# Patient Record
Sex: Female | Born: 1995 | Race: Asian | Hispanic: No | Marital: Single | Smoking: Never smoker
Health system: Southern US, Community
[De-identification: ages and names within clinical notes are randomized; demographics above are authoritative.]

## PROBLEM LIST (undated history)

## (undated) DIAGNOSIS — I471 Supraventricular tachycardia, unspecified: Secondary | ICD-10-CM

---

## 2020-10-05 ENCOUNTER — Observation Stay (HOSPITAL_COMMUNITY)
Admission: EM | Admit: 2020-10-05 | Discharge: 2020-10-07 | Disposition: A | Discharge status: Home / Self Care | Payer: Self-pay | Attending: Internal Medicine | Admitting: Internal Medicine

## 2020-10-05 DIAGNOSIS — R7989 Other specified abnormal findings of blood chemistry: Secondary | ICD-10-CM | POA: Diagnosis present

## 2020-10-05 DIAGNOSIS — R748 Abnormal levels of other serum enzymes: Secondary | ICD-10-CM | POA: Insufficient documentation

## 2020-10-05 DIAGNOSIS — I471 Supraventricular tachycardia, unspecified: Secondary | ICD-10-CM

## 2020-10-05 DIAGNOSIS — R778 Other specified abnormalities of plasma proteins: Secondary | ICD-10-CM | POA: Diagnosis present

## 2020-10-05 DIAGNOSIS — I214 Non-ST elevation (NSTEMI) myocardial infarction: Secondary | ICD-10-CM

## 2020-10-05 DIAGNOSIS — Z20822 Contact with and (suspected) exposure to covid-19: Secondary | ICD-10-CM | POA: Insufficient documentation

## 2020-10-05 HISTORY — DX: Supraventricular tachycardia, unspecified: I47.10

## 2020-10-05 HISTORY — DX: Supraventricular tachycardia: I47.1

## 2020-10-06 ENCOUNTER — Encounter (HOSPITAL_COMMUNITY): Payer: Self-pay

## 2020-10-06 ENCOUNTER — Observation Stay (HOSPITAL_BASED_OUTPATIENT_CLINIC_OR_DEPARTMENT_OTHER): Payer: Self-pay

## 2020-10-06 ENCOUNTER — Other Ambulatory Visit: Payer: Self-pay

## 2020-10-06 ENCOUNTER — Emergency Department (HOSPITAL_COMMUNITY): Payer: Self-pay

## 2020-10-06 DIAGNOSIS — R7989 Other specified abnormal findings of blood chemistry: Secondary | ICD-10-CM

## 2020-10-06 DIAGNOSIS — I471 Supraventricular tachycardia, unspecified: Secondary | ICD-10-CM

## 2020-10-06 DIAGNOSIS — I472 Ventricular tachycardia: Secondary | ICD-10-CM

## 2020-10-06 DIAGNOSIS — R778 Other specified abnormalities of plasma proteins: Secondary | ICD-10-CM

## 2020-10-06 LAB — I-STAT CHEM 8, ED
BUN: 21 mg/dL — ABNORMAL HIGH (ref 6–20)
Calcium, Ion: 1.08 mmol/L — ABNORMAL LOW (ref 1.15–1.40)
Chloride: 106 mmol/L (ref 98–111)
Creatinine, Ser: 0.6 mg/dL (ref 0.44–1.00)
Glucose, Bld: 149 mg/dL — ABNORMAL HIGH (ref 70–99)
HCT: 40 % (ref 36.0–46.0)
Hemoglobin: 13.6 g/dL (ref 12.0–15.0)
Potassium: 5.5 mmol/L — ABNORMAL HIGH (ref 3.5–5.1)
Sodium: 139 mmol/L (ref 135–145)
TCO2: 25 mmol/L (ref 22–32)

## 2020-10-06 LAB — I-STAT BETA HCG BLOOD, ED (MC, WL, AP ONLY): I-stat hCG, quantitative: <5 m[IU]/mL (ref <5)

## 2020-10-06 LAB — LIPID PANEL
Cholesterol: 130 mg/dL (ref 0–200)
HDL: 57 mg/dL (ref >40)
LDL Cholesterol: 67 mg/dL (ref 0–99)
Total CHOL/HDL Ratio: 2.3 RATIO
Triglycerides: 28 mg/dL (ref <150)
VLDL: 6 mg/dL (ref 0–40)

## 2020-10-06 LAB — CBC WITH DIFFERENTIAL/PLATELET
Abs Immature Granulocytes: 0.04 10*3/uL (ref 0.00–0.07)
Basophils Absolute: 0 10*3/uL (ref 0.0–0.1)
Basophils Relative: 0 %
Eosinophils Absolute: 0 10*3/uL (ref 0.0–0.5)
Eosinophils Relative: 0 %
HCT: 42.5 % (ref 36.0–46.0)
Hemoglobin: 13.8 g/dL (ref 12.0–15.0)
Immature Granulocytes: 0 %
Lymphocytes Relative: 7 %
Lymphs Abs: 0.9 10*3/uL (ref 0.7–4.0)
MCH: 28.4 pg (ref 26.0–34.0)
MCHC: 32.5 g/dL (ref 30.0–36.0)
MCV: 87.4 fL (ref 80.0–100.0)
Monocytes Absolute: 0.4 10*3/uL (ref 0.1–1.0)
Monocytes Relative: 3 %
Neutro Abs: 11.9 10*3/uL — ABNORMAL HIGH (ref 1.7–7.7)
Neutrophils Relative %: 90 %
Platelets: 254 10*3/uL (ref 150–400)
RBC: 4.86 MIL/uL (ref 3.87–5.11)
RDW: 13.6 % (ref 11.5–15.5)
WBC: 13.3 10*3/uL — ABNORMAL HIGH (ref 4.0–10.5)
nRBC: 0 % (ref 0.0–0.2)

## 2020-10-06 LAB — COMPREHENSIVE METABOLIC PANEL
ALT: 39 U/L (ref 0–44)
AST: 64 U/L — ABNORMAL HIGH (ref 15–41)
Albumin: 4.7 g/dL (ref 3.5–5.0)
Alkaline Phosphatase: 52 U/L (ref 38–126)
Anion gap: 12 (ref 5–15)
BUN: 16 mg/dL (ref 6–20)
CO2: 21 mmol/L — ABNORMAL LOW (ref 22–32)
Calcium: 9.4 mg/dL (ref 8.9–10.3)
Chloride: 106 mmol/L (ref 98–111)
Creatinine, Ser: 0.81 mg/dL (ref 0.44–1.00)
GFR, Estimated: >60 mL/min (ref >60)
Glucose, Bld: 149 mg/dL — ABNORMAL HIGH (ref 70–99)
Potassium: 4.7 mmol/L (ref 3.5–5.1)
Sodium: 139 mmol/L (ref 135–145)
Total Bilirubin: 1.1 mg/dL (ref 0.3–1.2)
Total Protein: 7.4 g/dL (ref 6.5–8.1)

## 2020-10-06 LAB — RAPID URINE DRUG SCREEN, HOSP PERFORMED
Amphetamines: NOT DETECTED
Barbiturates: NOT DETECTED
Benzodiazepines: NOT DETECTED
Cocaine: NOT DETECTED
Opiates: NOT DETECTED
Tetrahydrocannabinol: NOT DETECTED

## 2020-10-06 LAB — BASIC METABOLIC PANEL
Anion gap: 5 (ref 5–15)
BUN: 12 mg/dL (ref 6–20)
CO2: 21 mmol/L — ABNORMAL LOW (ref 22–32)
Calcium: 7.2 mg/dL — ABNORMAL LOW (ref 8.9–10.3)
Chloride: 112 mmol/L — ABNORMAL HIGH (ref 98–111)
Creatinine, Ser: 0.46 mg/dL (ref 0.44–1.00)
GFR, Estimated: >60 mL/min (ref >60)
Glucose, Bld: 103 mg/dL — ABNORMAL HIGH (ref 70–99)
Potassium: 3.5 mmol/L (ref 3.5–5.1)
Sodium: 138 mmol/L (ref 135–145)

## 2020-10-06 LAB — HEMOGLOBIN A1C
Hgb A1c MFr Bld: 5.4 % (ref 4.8–5.6)
Mean Plasma Glucose: 108.28 mg/dL

## 2020-10-06 LAB — TSH: TSH: 0.622 u[IU]/mL (ref 0.350–4.500)

## 2020-10-06 LAB — TROPONIN I (HIGH SENSITIVITY)
Troponin I (High Sensitivity): 2386 ng/L (ref <18)
Troponin I (High Sensitivity): 4525 ng/L (ref <18)
Troponin I (High Sensitivity): 6125 ng/L (ref <18)
Troponin I (High Sensitivity): 6446 ng/L (ref <18)

## 2020-10-06 LAB — ECHOCARDIOGRAM COMPLETE
Area-P 1/2: 3.91 cm2
Height: 65 in
S' Lateral: 3 cm
Weight: 1504 oz

## 2020-10-06 LAB — RESP PANEL BY RT-PCR (FLU A&B, COVID) ARPGX2
Influenza A by PCR: NEGATIVE
Influenza B by PCR: NEGATIVE
SARS Coronavirus 2 by RT PCR: NEGATIVE

## 2020-10-06 LAB — BRAIN NATRIURETIC PEPTIDE: B Natriuretic Peptide: 286.6 pg/mL — ABNORMAL HIGH (ref 0.0–100.0)

## 2020-10-06 LAB — HIV ANTIBODY (ROUTINE TESTING W REFLEX): HIV Screen 4th Generation wRfx: NONREACTIVE

## 2020-10-06 MED ORDER — ACETAMINOPHEN 325 MG PO TABS: 650.0000 mg | ORAL_TABLET | Freq: Four times a day (QID) | ORAL | Status: DC | PRN

## 2020-10-06 MED ORDER — ENOXAPARIN SODIUM 30 MG/0.3ML ~~LOC~~ SOLN: 30.0000 mg | SUBCUTANEOUS | Status: DC

## 2020-10-06 MED ORDER — ASPIRIN 81 MG PO CHEW
CHEWABLE_TABLET | ORAL | Status: AC
  Filled 2020-10-06: qty 3

## 2020-10-06 MED ORDER — SODIUM CHLORIDE 0.9 % IV BOLUS
2000.0000 mL | Freq: Once | INTRAVENOUS | Status: AC
  Administered 2020-10-06: 2000 mL via INTRAVENOUS

## 2020-10-06 MED ORDER — ONDANSETRON HCL 4 MG/2ML IJ SOLN: 4.0000 mg | Freq: Four times a day (QID) | INTRAMUSCULAR | Status: DC | PRN

## 2020-10-06 MED ORDER — HEPARIN (PORCINE) 25000 UT/250ML-% IV SOLN
500.0000 [IU]/h | INTRAVENOUS | Status: DC
  Administered 2020-10-06: 500 [IU]/h via INTRAVENOUS
  Filled 2020-10-06: qty 250

## 2020-10-06 MED ORDER — ACETAMINOPHEN 650 MG RE SUPP: 650.0000 mg | Freq: Four times a day (QID) | RECTAL | Status: DC | PRN

## 2020-10-06 MED ORDER — HEPARIN BOLUS VIA INFUSION
2000.0000 [IU] | Freq: Once | INTRAVENOUS | Status: AC
  Administered 2020-10-06: 2000 [IU] via INTRAVENOUS
  Filled 2020-10-06: qty 2000

## 2020-10-06 MED ORDER — ASPIRIN 81 MG PO CHEW
324.0000 mg | CHEWABLE_TABLET | Freq: Once | ORAL | Status: AC
  Administered 2020-10-06: 324 mg via ORAL
  Filled 2020-10-06: qty 4

## 2020-10-06 MED ORDER — ADENOSINE 6 MG/2ML IV SOLN
6.0000 mg | Freq: Once | INTRAVENOUS | Status: AC
  Administered 2020-10-06: 6 mg via INTRAVENOUS
  Filled 2020-10-06: qty 2

## 2020-10-06 MED ORDER — ONDANSETRON HCL 4 MG/2ML IJ SOLN: 4.0000 mg | Freq: Once | INTRAMUSCULAR | Status: AC

## 2020-10-06 MED ORDER — POLYETHYLENE GLYCOL 3350 17 G PO PACK: 17.0000 g | PACK | Freq: Every day | ORAL | Status: DC | PRN

## 2020-10-06 MED ORDER — ONDANSETRON HCL 4 MG PO TABS: 4.0000 mg | ORAL_TABLET | Freq: Four times a day (QID) | ORAL | Status: DC | PRN

## 2020-10-06 MED ORDER — SODIUM CHLORIDE 0.9% FLUSH
3.0000 mL | Freq: Two times a day (BID) | INTRAVENOUS | Status: DC
  Administered 2020-10-06: 3 mL via INTRAVENOUS

## 2020-10-06 MED ORDER — IOHEXOL 350 MG/ML SOLN
100.0000 mL | Freq: Once | INTRAVENOUS | Status: AC | PRN
  Administered 2020-10-06: 85 mL via INTRAVENOUS

## 2020-10-06 MED ORDER — METOPROLOL SUCCINATE ER 25 MG PO TB24
12.5000 mg | ORAL_TABLET | Freq: Every day | ORAL | Status: DC
  Administered 2020-10-07: 12.5 mg via ORAL
  Filled 2020-10-06: qty 1

## 2020-10-06 MED ORDER — SODIUM CHLORIDE 0.9 % IV SOLN: INTRAVENOUS | Status: DC

## 2020-10-06 MED ORDER — ONDANSETRON HCL 4 MG/2ML IJ SOLN
INTRAMUSCULAR | Status: AC
  Administered 2020-10-06: 4 mg via INTRAVENOUS
  Filled 2020-10-06: qty 2

## 2020-10-06 MED ORDER — ENOXAPARIN SODIUM 40 MG/0.4ML ~~LOC~~ SOLN
40.0000 mg | SUBCUTANEOUS | Status: DC
  Administered 2020-10-06: 40 mg via SUBCUTANEOUS
  Filled 2020-10-06: qty 0.4

## 2020-10-06 NOTE — ED Notes (Signed)
Back from CT

## 2020-10-06 NOTE — ED Provider Notes (Signed)
632 am case d/w Cardiology via phone.  EDP expressed concerns about this increasing troponin despite optimal management.  Patient will be seen first this am.     Nicanor Alcon, Willetta York, MD 10/06/20 [phone]

## 2020-10-06 NOTE — Progress Notes (Signed)
Asked pt again about the IV fluids per MD order, but patient still refused.

## 2020-10-06 NOTE — Progress Notes (Signed)
Pt refused IVF to run at 66ml/hr per MD order due to her being up independently around the room and to the bathroom.

## 2020-10-06 NOTE — Plan of Care (Signed)
Patient presented with palpitations with associated nausea and dizziness.  She was found to have an SVT and was hypotensive. HR was in the 170s. She received adenosine x 1 and converted to NSR.  CTA chest was also negative for PE.  Troponin checked during SVT was markedly elevated (2386) and has since continued to rise [phone] She was started on a heparin drip during initial troponin elevation.  Case was discussed with cardiology.  Formal eval to follow. Initial EKG was noted to have subtle ST depression in inferior leads that resolved on repeat EKG.  BNP mildly elevated 286.  Patient has no known prior history of cardiac disease or heart failure. An echo has also been ordered. After adenosine, she has had no further symptoms and has remained comfortable.  Full H&P to follow.  Continue heparin drip and trending troponin.  Follow up echo.  Repeat EKG if any recurrent CP or palpitations.   Lewie Chamber, MD [facility] 10/06/2020, 6:38 AM

## 2020-10-06 NOTE — ED Notes (Signed)
Marla Roe (friend) can be reached at [phone]

## 2020-10-06 NOTE — ED Notes (Signed)
[facility] overnight paged: 3rd troponin 6125.

## 2020-10-06 NOTE — Plan of Care (Signed)
  Problem: Clinical Measurements: Goal: Will remain free from infection Outcome: Progressing Goal: Diagnostic test results will improve Outcome: Progressing Goal: Cardiovascular complication will be avoided Outcome: Progressing   Problem: Activity: Goal: Risk for activity intolerance will decrease Outcome: Progressing   Problem: Pain Managment: Goal: General experience of comfort will improve Outcome: Progressing   Problem: Safety: Goal: Ability to remain free from injury will improve Outcome: Progressing

## 2020-10-06 NOTE — Progress Notes (Signed)
This RN used ipad interpreter, Bonita Quin [phone] to complete admission assessment upon arrival to the floor.

## 2020-10-06 NOTE — H&P (Signed)
History and Physical  [name] TOI:[id number] DOB: 10-Oct-1995 DOA: 10/05/2020  PCP: Pcp, No Patient coming from: Home    Chief Complaint: Palpitations  HPI: Casey Romero is a 25 y.o. female Mandarin speaking patient with no significant past medical history comes into the hospital for palpitations that started the day prior to admission.  She relates this is happened one before it was a short episode where she was short of breath with palpitations that resolved within 10 minutes.  This episode started the day prior to admission and has been is constant she relates palpitation with shortness of breath nothing makes it better or worse she does relate some lightheadedness no loss of consciousness no sweating.  She did had some nausea that happened once but has now resolved. She denies any chest pressure, PND  In the ED: Twelve-lead EKG was done that showed normal axis SVT no appreciated ST segment depression.   Review of Systems: All systems reviewed and apart from history of presenting illness, are negative.  History reviewed. No pertinent past medical history. History reviewed. No pertinent surgical history. Social History:  has no history on file for tobacco use, alcohol use, and drug use.   No Known Allergies  Family History  Problem Relation Age of Onset  . Hypertension Father     Prior to Admission medications   Not on File   Physical Exam: Vitals:   10/06/20 0430 10/06/20 0500 10/06/20 0530 10/06/20 0600  BP: 97/67 103/69 101/67 103/63  Pulse: 68 69 61 70  Resp: 15 19 18 17   Temp:      TempSrc:      SpO2: 96% 100% 99% 97%  Weight:      Height:         General exam: Moderately built and nourished patient, lying comfortably supine on the gurney in no obvious distress.  Head, eyes and ENT: Nontraumatic and normocephalic. Pupils equally reacting to light and accommodation. Oral mucosa moist.  Neck: Supple. No JVD, carotid bruit or thyromegaly.  Lymphatics:  No lymphadenopathy.  Respiratory system: Clear to auscultation. No increased work of breathing.  Cardiovascular system: S1 and S2 heard, RRR. No JVD, murmurs, gallops, clicks or pedal edema.  Gastrointestinal system: Abdomen is nondistended, soft and nontender. Normal bowel sounds heard. No organomegaly or masses appreciated.  Central nervous system: Alert and oriented. No focal neurological deficits.  Extremities: Symmetric 5 x 5 power. Peripheral pulses symmetrically felt.   Skin: No rashes or acute findings.  Musculoskeletal system: Negative exam.  Psychiatry: Pleasant and cooperative.   Labs on Admission:  Basic Metabolic Panel: Recent Labs  Lab 10/06/20 0011 10/06/20 0016  NA 139 139  K 4.7 5.5*  CL 106 106  CO2 21*  --   GLUCOSE 149* 149*  BUN 16 21*  CREATININE 0.81 0.60  CALCIUM 9.4  --    Liver Function Tests: Recent Labs  Lab 10/06/20 0011  AST 64*  ALT 39  ALKPHOS 52  BILITOT 1.1  PROT 7.4  ALBUMIN 4.7   No results for input(s): LIPASE, AMYLASE in the last 168 hours. No results for input(s): AMMONIA in the last 168 hours. CBC: Recent Labs  Lab 10/06/20 0011 10/06/20 0016  WBC 13.3*  --   NEUTROABS 11.9*  --   HGB 13.8 13.6  HCT 42.5 40.0  MCV 87.4  --   PLT 254  --    Cardiac Enzymes: No results for input(s): CKTOTAL, CKMB, CKMBINDEX, TROPONINI in the  last 168 hours.  BNP (last 3 results) No results for input(s): PROBNP in the last 8760 hours. CBG: No results for input(s): GLUCAP in the last 168 hours.  Radiological Exams on Admission: CT Angio Chest PE W and/or Wo Contrast  Result Date: 10/06/2020 CLINICAL DATA:  Chest pain and shortness of breath starting earlier today. EXAM: CT ANGIOGRAPHY CHEST WITH CONTRAST TECHNIQUE: Multidetector CT imaging of the chest was performed using the standard protocol during bolus administration of intravenous contrast. Multiplanar CT image reconstructions and MIPs were obtained to evaluate the vascular  anatomy. CONTRAST:  81mL OMNIPAQUE IOHEXOL 350 MG/ML SOLN COMPARISON:  Chest radiograph 10/06/2020 FINDINGS: Cardiovascular: Good opacification of the central and segmental pulmonary arteries. No focal filling defects. No evidence of significant pulmonary embolus. Normal heart size. No pericardial effusions. Normal caliber thoracic aorta. Mediastinum/Nodes: Esophagus is decompressed. No significant lymphadenopathy in the chest. Increased density in the anterior mediastinum likely representing residual thymic tissue. Lungs/Pleura: Lungs are clear. No pleural effusions. No pneumothorax. Upper Abdomen: No acute abnormalities demonstrated in the visualized upper abdomen. Musculoskeletal: No chest wall abnormality. No acute or significant osseous findings. Review of the MIP images confirms the above findings. IMPRESSION: 1. No evidence of significant pulmonary embolus. 2. No evidence of active pulmonary disease. Electronically Signed   By: Burman Nieves M.D.   On: 10/06/2020 01:01   DG Chest Portable 1 View  Result Date: 10/06/2020 CLINICAL DATA:  Dyspnea, hypotension EXAM: PORTABLE CHEST 1 VIEW COMPARISON:  None. FINDINGS: The heart size and mediastinal contours are within normal limits. Both lungs are clear. The visualized skeletal structures are unremarkable. IMPRESSION: No active disease. Electronically Signed   By: Helyn Numbers MD   On: 10/06/2020 00:32    EKG: Independently reviewed.  Normal axis appears to be SVT no significant ST segment depression.  Has a wandering base. Lead EKG after adenosine was given shows sinus rhythm normal axis normal interval no ST segment abnormalities she is symptom-free.  Assessment/Plan Supraventricular tachycardia: Twelve-lead EKG show SVT she was given a dose of adenosine and she has converted back to sinus rhythm she relates her symptoms has resolved. Admit under observation to a telemetry unit. Check a 2D echo, I will place an order of IV diltiazem push as  needed. Cardiology has been consulted awaiting further recommendations.  Will probably need further evaluation as an outpatient. Angio the chest was negative for PE or cardiopulmonary disease.  Elevated troponin Likely demand ischemia and a heart rate of 1 70-200.  She denies any chest pain after she was given adenosine and her heart rate improved she relates her shortness of breath and palpitations resolved. There is another Theodis Aguas ordered it will probably be elevated has been less than 4 hours since her symptoms were corrected.   DVT Prophylaxis: lovenox Code Status: full  Family Communication: none  Disposition Plan: inpatient      It is my clinical opinion that admission to observation is reasonable and necessary in this 25 y.o. female Who presented with symptoms of palpitation and shortness of breath was found to be in SVT resolved with adenosine.   Marinda Elk MD [facility]   10/06/2020, 7:29 AM

## 2020-10-06 NOTE — Plan of Care (Signed)
  Problem: Activity: Goal: Risk for activity intolerance will decrease Outcome: Progressing   Problem: Pain Managment: Goal: General experience of comfort will improve Outcome: Progressing   Problem: Safety: Goal: Ability to remain free from injury will improve Outcome: Progressing   Problem: Coping: Goal: Level of anxiety will decrease Outcome: Progressing   Problem: Skin Integrity: Goal: Risk for impaired skin integrity will decrease Outcome: Progressing

## 2020-10-06 NOTE — ED Triage Notes (Signed)
Pt presents from home, c/o palpitations starting around 11am. Also c/o nausea and dizziness. Pt is from [place] and states something like this has happened once before

## 2020-10-06 NOTE — Progress Notes (Signed)
  Echocardiogram 2D Echocardiogram has been performed.  Augustine Radar 10/06/2020, 8:45 AM

## 2020-10-06 NOTE — ED Notes (Signed)
Patients friend has left.

## 2020-10-06 NOTE — ED Provider Notes (Addendum)
[facility]-EMERGENCY DEPT Provider Note   CSN: [id number] Arrival date & time: 10/05/20  2340     History Chief Complaint  Patient presents with  . Palpitations    Casey Romero is a 25 y.o. female.  The history is provided by the patient. The history is limited by a language barrier.  Palpitations Palpitations quality:  Fast Onset quality:  Sudden Duration:  13 hours (started at 11 am while cleaning) Timing:  Constant Progression:  Unchanged Chronicity:  New Context: not anxiety, not caffeine, not illicit drugs, not nicotine and not stimulant use   Relieved by:  Nothing Worsened by:  Nothing Ineffective treatments:  None tried Associated symptoms: chest pressure, nausea, PND, shortness of breath and vomiting   Risk factors: no hx of PE and no hyperthyroidism   Patient with no PMH nor home medications presents with 12 hours of palpitation and SOB, n/v and some pressure that started while she was cleaning.       History reviewed. No pertinent past medical history.  There are no problems to display for this patient.   History reviewed. No pertinent surgical history.   OB History   No obstetric history on file.     History reviewed. No pertinent family history.     Home Medications Prior to Admission medications   Not on File    Allergies    Patient has no known allergies.  Review of Systems   Review of Systems  Unable to perform ROS: Acuity of condition  Respiratory: Positive for shortness of breath.   Cardiovascular: Positive for palpitations and PND.  Gastrointestinal: Positive for nausea and vomiting.  Neurological: Negative for facial asymmetry.  Psychiatric/Behavioral: Negative for agitation.    Physical Exam Updated Vital Signs BP 106/73   Pulse 73   Temp 98 F (36.7 C) (Oral)   Resp (!) 22   Ht 5\' 5"  (1.651 m)   Wt 42.6 kg   LMP 10/01/2020   SpO2 100%   BMI 15.64 kg/m   Physical Exam Vitals and nursing note  reviewed.  Constitutional:      Appearance: She is diaphoretic.  HENT:     Head: Normocephalic and atraumatic.     Nose: Nose normal.  Eyes:     Conjunctiva/sclera: Conjunctivae normal.     Pupils: Pupils are equal, round, and reactive to light.  Cardiovascular:     Rate and Rhythm: Normal rate and regular rhythm.     Pulses: Normal pulses.     Heart sounds: Normal heart sounds.  Pulmonary:     Effort: Pulmonary effort is normal.     Breath sounds: Normal breath sounds.  Abdominal:     General: Abdomen is flat. Bowel sounds are normal.     Palpations: Abdomen is soft.     Tenderness: There is no abdominal tenderness. There is no guarding.  Musculoskeletal:        General: Normal range of motion.     Cervical back: Normal range of motion and neck supple.  Skin:    General: Skin is warm.     Capillary Refill: Capillary refill takes less than 2 seconds.     Comments: Mottling of the upper chest   Neurological:     General: No focal deficit present.     Mental Status: She is alert and oriented to person, place, and time.     Deep Tendon Reflexes: Reflexes normal.  Psychiatric:        Mood and  Affect: Mood normal.        Behavior: Behavior normal.     ED Results / Procedures / Treatments   Labs (all labs ordered are listed, but only abnormal results are displayed) Results for orders placed or performed during the hospital encounter of 10/05/20  CBC with Differential/Platelet  Result Value Ref Range   WBC 13.3 (H) 4.0 - 10.5 K/uL   RBC 4.86 3.87 - 5.11 MIL/uL   Hemoglobin 13.8 12.0 - 15.0 g/dL   HCT 78.242.5 95.636.0 - 21.346.0 %   MCV 87.4 80.0 - 100.0 fL   MCH 28.4 26.0 - 34.0 pg   MCHC 32.5 30.0 - 36.0 g/dL   RDW 08.613.6 57.811.5 - 46.915.5 %   Platelets 254 150 - 400 K/uL   nRBC 0.0 0.0 - 0.2 %   Neutrophils Relative % 90 %   Neutro Abs 11.9 (H) 1.7 - 7.7 K/uL   Lymphocytes Relative 7 %   Lymphs Abs 0.9 0.7 - 4.0 K/uL   Monocytes Relative 3 %   Monocytes Absolute 0.4 0.1 - 1.0 K/uL    Eosinophils Relative 0 %   Eosinophils Absolute 0.0 0.0 - 0.5 K/uL   Basophils Relative 0 %   Basophils Absolute 0.0 0.0 - 0.1 K/uL   Immature Granulocytes 0 %   Abs Immature Granulocytes 0.04 0.00 - 0.07 K/uL  Comprehensive metabolic panel  Result Value Ref Range   Sodium 139 135 - 145 mmol/L   Potassium 4.7 3.5 - 5.1 mmol/L   Chloride 106 98 - 111 mmol/L   CO2 21 (L) 22 - 32 mmol/L   Glucose, Bld 149 (H) 70 - 99 mg/dL   BUN 16 6 - 20 mg/dL   Creatinine, Ser 6.290.81 0.44 - 1.00 mg/dL   Calcium 9.4 8.9 - 52.810.3 mg/dL   Total Protein 7.4 6.5 - 8.1 g/dL   Albumin 4.7 3.5 - 5.0 g/dL   AST 64 (H) 15 - 41 U/L   ALT 39 0 - 44 U/L   Alkaline Phosphatase 52 38 - 126 U/L   Total Bilirubin 1.1 0.3 - 1.2 mg/dL   GFR, Estimated >41>60 >32>60 mL/min   Anion gap 12 5 - 15  I-stat chem 8, ED ([facility])  Result Value Ref Range   Sodium 139 135 - 145 mmol/L   Potassium 5.5 (H) 3.5 - 5.1 mmol/L   Chloride 106 98 - 111 mmol/L   BUN 21 (H) 6 - 20 mg/dL   Creatinine, Ser 4.400.60 0.44 - 1.00 mg/dL   Glucose, Bld 102149 (H) 70 - 99 mg/dL   Calcium, Ion 7.251.08 (L) 1.15 - 1.40 mmol/L   TCO2 25 22 - 32 mmol/L   Hemoglobin 13.6 12.0 - 15.0 g/dL   HCT 36.640.0 44.036.0 - 34.746.0 %  I-Stat Beta hCG blood, ED ([facility], [facility], [facility] only)  Result Value Ref Range   I-stat hCG, quantitative <5.0 <5 mIU/mL   Comment 3          Troponin I (High Sensitivity)  Result Value Ref Range   Troponin I (High Sensitivity) 2,386 (HH) <18 ng/L   CT Angio Chest PE W and/or Wo Contrast  Result Date: 10/06/2020 CLINICAL DATA:  Chest pain and shortness of breath starting earlier today. EXAM: CT ANGIOGRAPHY CHEST WITH CONTRAST TECHNIQUE: Multidetector CT imaging of the chest was performed using the standard protocol during bolus administration of intravenous contrast. Multiplanar CT image reconstructions and MIPs were obtained to evaluate the vascular anatomy. CONTRAST:  3mL OMNIPAQUE IOHEXOL 350 MG/ML SOLN COMPARISON:  Chest radiograph 10/06/2020  FINDINGS: Cardiovascular: Good opacification of the central and segmental pulmonary arteries. No focal filling defects. No evidence of significant pulmonary embolus. Normal heart size. No pericardial effusions. Normal caliber thoracic aorta. Mediastinum/Nodes: Esophagus is decompressed. No significant lymphadenopathy in the chest. Increased density in the anterior mediastinum likely representing residual thymic tissue. Lungs/Pleura: Lungs are clear. No pleural effusions. No pneumothorax. Upper Abdomen: No acute abnormalities demonstrated in the visualized upper abdomen. Musculoskeletal: No chest wall abnormality. No acute or significant osseous findings. Review of the MIP images confirms the above findings. IMPRESSION: 1. No evidence of significant pulmonary embolus. 2. No evidence of active pulmonary disease. Electronically Signed   By: Burman Nieves M.D.   On: 10/06/2020 01:01   DG Chest Portable 1 View  Result Date: 10/06/2020 CLINICAL DATA:  Dyspnea, hypotension EXAM: PORTABLE CHEST 1 VIEW COMPARISON:  None. FINDINGS: The heart size and mediastinal contours are within normal limits. Both lungs are clear. The visualized skeletal structures are unremarkable. IMPRESSION: No active disease. Electronically Signed   By: Helyn Numbers MD   On: 10/06/2020 00:32    EKG  EKG Interpretation  Date/Time:  Tuesday Woodward Klem 26 2022 00:22:13 EDT Ventricular Rate:  53 PR Interval:  132 QRS Duration: 124 QT Interval:  407 QTC Calculation: 383 R Axis:   85 Text Interpretation: Sinus rhythm IVCD, consider atypical RBBB Confirmed by Nicanor Alcon, Elric Tirado ([id number]) on 10/06/2020 1:49:41 AM       Radiology CT Angio Chest PE W and/or Wo Contrast  Result Date: 10/06/2020 CLINICAL DATA:  Chest pain and shortness of breath starting earlier today. EXAM: CT ANGIOGRAPHY CHEST WITH CONTRAST TECHNIQUE: Multidetector CT imaging of the chest was performed using the standard protocol during bolus administration of intravenous  contrast. Multiplanar CT image reconstructions and MIPs were obtained to evaluate the vascular anatomy. CONTRAST:  65mL OMNIPAQUE IOHEXOL 350 MG/ML SOLN COMPARISON:  Chest radiograph 10/06/2020 FINDINGS: Cardiovascular: Good opacification of the central and segmental pulmonary arteries. No focal filling defects. No evidence of significant pulmonary embolus. Normal heart size. No pericardial effusions. Normal caliber thoracic aorta. Mediastinum/Nodes: Esophagus is decompressed. No significant lymphadenopathy in the chest. Increased density in the anterior mediastinum likely representing residual thymic tissue. Lungs/Pleura: Lungs are clear. No pleural effusions. No pneumothorax. Upper Abdomen: No acute abnormalities demonstrated in the visualized upper abdomen. Musculoskeletal: No chest wall abnormality. No acute or significant osseous findings. Review of the MIP images confirms the above findings. IMPRESSION: 1. No evidence of significant pulmonary embolus. 2. No evidence of active pulmonary disease. Electronically Signed   By: Burman Nieves M.D.   On: 10/06/2020 01:01   DG Chest Portable 1 View  Result Date: 10/06/2020 CLINICAL DATA:  Dyspnea, hypotension EXAM: PORTABLE CHEST 1 VIEW COMPARISON:  None. FINDINGS: The heart size and mediastinal contours are within normal limits. Both lungs are clear. The visualized skeletal structures are unremarkable. IMPRESSION: No active disease. Electronically Signed   By: Helyn Numbers MD   On: 10/06/2020 00:32    Procedures Adenosine Cardioversion: Patient with HR in the 170s, symptomatic x 13 hours.  Placed on zoll anterior posterior pads, recording function on.   6 mg of adenosine given with bolus of IVF with immediate conversion to sinus bradycardia.    Medications Ordered in ED Medications  0.9 %  sodium chloride infusion (has no administration in time range)  heparin ADULT infusion 100 units/mL (25000 units/265mL) (has no administration in time range)   heparin  bolus via infusion 2,000 Units (has no administration in time range)  adenosine (ADENOCARD) 6 MG/2ML injection 6 mg (6 mg Intravenous Given 10/06/20 0019)  ondansetron (ZOFRAN) injection 4 mg (4 mg Intravenous Given 10/06/20 0012)  sodium chloride 0.9 % bolus 2,000 mL (0 mLs Intravenous Stopped 10/06/20 0124)  iohexol (OMNIPAQUE) 350 MG/ML injection 100 mL (85 mLs Intravenous Contrast Given 10/06/20 0039)  aspirin chewable tablet 324 mg ( Oral Not Given 10/06/20 0132)    ED Course  I have reviewed the triage vital signs and the nursing notes.  Pertinent labs & imaging results that were available during my care of the patient were reviewed by me and considered in my medical decision making (see chart for details).   136 Case d/w Dr. Rosita Fire of cardiology who states patient can be admitted to medicine at [facility]. EDP asked again if patient should be admitted to cardiology given EKG changes and elevation of troponin in a healthy female. Informed patient can be admitted to medicine at [facility] and to cycle cardiac enzymes.  No heparin at this time  EDP made a decision based on degree of concern to start heparin.    Given marked elevation of second troponin EDP consulted cardiology again.  No call back.  I will continue to cycle cardiac enzymes and to hydrate the patient.    MDM Reviewed: nursing note and vitals Interpretation: ECG, x-ray and labs (NACPD by me on cxr, positive troponin normal electrolytes ) Total time providing critical care: 75-105 minutes (heparin drip ). This excludes time spent performing separately reportable procedures and services. Consults: cardiology and admitting MD (case d/w Dr. Rosita Fire of cardiology, please admit to medicine and obtain echo in am )  CRITICAL CARE Performed by: Katha Kuehne K Siris Hoos-Rasch Total critical care time: 75 minutes Critical care time was exclusive of separately billable procedures and treating other patients. Critical care was necessary to treat or prevent  imminent or life-threatening deterioration. Critical care was time spent personally by me on the following activities: development of treatment plan with patient and/or surrogate as well as nursing, discussions with consultants, evaluation of patient's response to treatment, examination of patient, obtaining history from patient or surrogate, ordering and performing treatments and interventions, ordering and review of laboratory studies, ordering and review of radiographic studies, pulse oximetry and re-evaluation of patient's condition.  Final Clinical Impression(s) / ED Diagnoses Final diagnoses:  SVT (supraventricular tachycardia) (HCC)   Admit to medicine        Cambrie Sonnenfeld, MD 10/06/20 [id number]

## 2020-10-06 NOTE — Progress Notes (Signed)
ANTICOAGULATION CONSULT NOTE - Initial Consult  Pharmacy Consult for Heparin Indication: chest pain/ACS  No Known Allergies  Patient Measurements: Height: 5\' 5"  (165.1 cm) Weight: 42.6 kg (94 lb) IBW/kg (Calculated) : 57 Heparin Dosing Weight: actual body weight  Vital Signs: Temp: 98 F (36.7 C) (04/25 2349) Temp Source: Oral (04/25 2349) BP: 108/69 (04/26 0200) Pulse Rate: 73 (04/26 0200)  Labs: Recent Labs    10/06/20 0011 10/06/20 0016  HGB 13.8 13.6  HCT 42.5 40.0  PLT 254  --   CREATININE 0.81 0.60  TROPONINIHS 2,386*  --     Estimated Creatinine Clearance: 72.3 mL/min (by C-G formula based on SCr of 0.6 mg/dL).   Medical History: History reviewed. No pertinent past medical history.  Medications:  No medications PTA reported  Assessment:  25 yr female presents with palpitations, chest pressure, nausea, SOB and vomiting  No significant PMH  Pharmacy asked to dose IV heparin  Goal of Therapy:  Heparin level 0.3-0.7 units/ml Monitor platelets by anticoagulation protocol: Yes   Plan:   Heparin 2000 units IV bolus x 1 followed by heparin gtt @ 500 units/hr  Check heparin level 6 hr after heparin gtt started  Follow daily heparin level and CBC  Monitor for signs/symptoms of bleeding  22, PharmD 10/06/2020,2:09 AM

## 2020-10-06 NOTE — ED Notes (Signed)
Patient is resting in room. Complains of no chest pain or shortness of breath. Patient was given ice chips.

## 2020-10-06 NOTE — ED Notes (Signed)
Trop Ferne Reus , RN aware

## 2020-10-06 NOTE — Consult Note (Addendum)
Cardiology Consultation:   Patient ID: Casey Romero MRN: [id number]; DOB: 04-03-1996  Admit date: 10/05/2020 Date of Consult: 10/06/2020  PCP:  [facility]  Cardiologist:  Casey Nose, MD new  Patient Profile:   Casey Romero is a 25 y.o. female with a hx of palpitations who is being seen today for the evaluation of SVT and elevated troponin at the request of Dr. Robb Romero.  History of Present Illness:   Casey Romero has no prior cardiac history. She is visiting from [place]. She presented to [facility] with palpitations starting at 11AM found to be in SVT with heart rate 173. This was accompanied by chest pressure and shortness of breath. She was given adenosine 6 mg and converted to sinus rhythm HR 53. HS troponin 2386 --> 4525 --> 6125 --> 6446. EDP consulted cardiology who recommended against heparin gtt. EDP started heparin drip due to concern with rising troponin.   TSH 0.622 WBC 13.3 AST 64 K 5.5 BNP 286  She states she has had palpitations once before associated with SOB and lasted about 10 min before spontaneously resolving. This occurred several years ago (8-10 years ago) when she was in high school. She reports resolution of her chest discomfort and feels much better at this time. Symptoms started at approximately 11AM yesterday - it is possible she was in SVT for over 12 hrs prior to conversion with adenosine. During that time, she does report chest tightness, SOB, and seeing spots with mild dizziness. She did not pass out.  She does state that her mother has had episodes very similar to this and is now on medication.   She does not smoke or drink. She has lived in [place] for 4-5 years. Her aunt and her aunt's family live in [place]. She is currently not working and not in school. She is only visiting [place] and is not sure when she will return to [place]. I will make her follow up with [place] in case she is still in [place].     Past Medical History:  Diagnosis Date  . PSVT  (paroxysmal supraventricular tachycardia) (HCC)     History reviewed. No pertinent surgical history.   Home Medications:  Prior to Admission medications   Not on File    Inpatient Medications: Scheduled Meds: . enoxaparin (LOVENOX) injection  40 mg Subcutaneous Q24H   Continuous Infusions: . sodium chloride     PRN Meds:   Allergies:   No Known Allergies  Social History:   Social History   Socioeconomic History  . Marital status: Single    Spouse name: Not on file  . Number of children: Not on file  . Years of education: Not on file  . Highest education level: Not on file  Occupational History  . Not on file  Tobacco Use  . Smoking status: Never Smoker  . Smokeless tobacco: Never Used  Substance and Sexual Activity  . Alcohol use: Not Currently  . Drug use: Not on file  . Sexual activity: Not on file  Other Topics Concern  . Not on file  Social History Narrative  . Not on file   Social Determinants of Health   Financial Resource Strain: Not on file  Food Insecurity: Not on file  Transportation Needs: Not on file  Physical Activity: Not on file  Stress: Not on file  Social Connections: Not on file  Intimate Partner Violence: Not on file    Family History:  Family History  Problem Relation Age of Onset  . Hypertension Father   . Supraventricular tachycardia Mother      ROS:  Please see the history of present illness.   All other ROS reviewed and negative.     Physical Exam/Data:   Vitals:   10/06/20 0430 10/06/20 0500 10/06/20 0530 10/06/20 0600  BP: 97/67 103/69 101/67 103/63  Pulse: 68 69 61 70  Resp: 15 19 18 17   Temp:      TempSrc:      SpO2: 96% 100% 99% 97%  Weight:      Height:        Intake/Output Summary (Last 24 hours) at 10/06/2020 0956 Last data filed at 10/06/2020 0124 Gross per 24 hour  Intake 2000 ml  Output --  Net 2000 ml   Last 3 Weights 10/05/2020  Weight (lbs) 94 lb  Weight (kg) 42.638 kg     Body mass index  is 15.64 kg/m.  General:  Well nourished, well developed, in no acute distress HEENT: normal Lymph: no adenopathy Neck: no JVD Endocrine:  No thryomegaly Vascular: No carotid bruits; FA pulses 2+ bilaterally without bruits  Cardiac:  normal S1, S2; RRR; no murmur  Lungs:  clear to auscultation bilaterally, no wheezing, rhonchi or rales  Abd: soft, nontender, no hepatomegaly  Ext: no edema Musculoskeletal:  No deformities, BUE and BLE strength normal and equal Skin: warm and dry  Neuro:  CNs 2-12 intact, no focal abnormalities noted Psych:  Normal affect   EKG:  The EKG was personally reviewed and demonstrates:  Sinus rhythm HR 78 Telemetry:  Telemetry was personally reviewed and demonstrates:  SVT with rates in the 170s, adenosine administration resulted in PVCs and conversion to sinus rhythm, now in sinus in the 70s with PVCs  Relevant CV Studies:  Echo pending  Laboratory Data:  High Sensitivity Troponin:   Recent Labs  Lab 10/06/20 0011 10/06/20 0212 10/06/20 0518 10/06/20 0730  TROPONINIHS 2,386* 4,525* 6,125* 6,446*     Chemistry Recent Labs  Lab 10/06/20 0011 10/06/20 0016 10/06/20 0730  NA 139 139 138  K 4.7 5.5* 3.5  CL 106 106 112*  CO2 21*  --  21*  GLUCOSE 149* 149* 103*  BUN 16 21* 12  CREATININE 0.81 0.60 0.46  CALCIUM 9.4  --  7.2*  GFRNONAA >60  --  >60  ANIONGAP 12  --  5    Recent Labs  Lab 10/06/20 0011  PROT 7.4  ALBUMIN 4.7  AST 64*  ALT 39  ALKPHOS 52  BILITOT 1.1   Hematology Recent Labs  Lab 10/06/20 0011 10/06/20 0016  WBC 13.3*  --   RBC 4.86  --   HGB 13.8 13.6  HCT 42.5 40.0  MCV 87.4  --   MCH 28.4  --   MCHC 32.5  --   RDW 13.6  --   PLT 254  --    BNP Recent Labs  Lab 10/06/20 0505  BNP 286.6*    DDimer No results for input(s): DDIMER in the last 168 hours.   Radiology/Studies:  CT Angio Chest PE W and/or Wo Contrast  Result Date: 10/06/2020 CLINICAL DATA:  Chest pain and shortness of breath  starting earlier today. EXAM: CT ANGIOGRAPHY CHEST WITH CONTRAST TECHNIQUE: Multidetector CT imaging of the chest was performed using the standard protocol during bolus administration of intravenous contrast. Multiplanar CT image reconstructions and MIPs were obtained to evaluate the vascular anatomy. CONTRAST:  57mL OMNIPAQUE  IOHEXOL 350 MG/ML SOLN COMPARISON:  Chest radiograph 10/06/2020 FINDINGS: Cardiovascular: Good opacification of the central and segmental pulmonary arteries. No focal filling defects. No evidence of significant pulmonary embolus. Normal heart size. No pericardial effusions. Normal caliber thoracic aorta. Mediastinum/Nodes: Esophagus is decompressed. No significant lymphadenopathy in the chest. Increased density in the anterior mediastinum likely representing residual thymic tissue. Lungs/Pleura: Lungs are clear. No pleural effusions. No pneumothorax. Upper Abdomen: No acute abnormalities demonstrated in the visualized upper abdomen. Musculoskeletal: No chest wall abnormality. No acute or significant osseous findings. Review of the MIP images confirms the above findings. IMPRESSION: 1. No evidence of significant pulmonary embolus. 2. No evidence of active pulmonary disease. Electronically Signed   By: Burman Nieves M.D.   On: 10/06/2020 01:01   DG Chest Portable 1 View  Result Date: 10/06/2020 CLINICAL DATA:  Dyspnea, hypotension EXAM: PORTABLE CHEST 1 VIEW COMPARISON:  None. FINDINGS: The heart size and mediastinal contours are within normal limits. Both lungs are clear. The visualized skeletal structures are unremarkable. IMPRESSION: No active disease. Electronically Signed   By: Helyn Numbers MD   On: 10/06/2020 00:32     Assessment and Plan:   Paroxysmal SVT - HR in the 170s on arrival with 12-leak concerning for SVT - she was given adenosine x 1 dose with conversion to sinus rhythm - symptoms resolved, no recurrence of palpitations, SOB, or chest pressure - TSH WNL -  echo pending - if echo normal, consider discharge home - may consider verapamil vs cardizem at discharge   Elevated troponin - hs troponin 2386 --> 4525 --> 6125 --> 6446 - CTA negative for PE and does not mention aortic atherosclerosis or coronary calcification - EDP started heparin gtt overnight - CE appear to be leveling off - do not suspect ACS, likely due to greater than 12 hrs of SVT - she is much more confortable - will defer decisions regarding heparin to attending   I discussed with her two vagal maneuvers she may try if this occurs again. We also discussed ablation if this persisted.   I will make her a follow up appt with [place] in the event that she is still in [place].    Risk Assessment/Risk Scores:      For questions or updates, please contact [facility] Please consult [email or web address] for contact info under    Signed, Marcelino Duster, PA  10/06/2020 9:56 AM

## 2020-10-06 NOTE — ED Notes (Signed)
Patient is attempting to void using the bedpan.

## 2020-10-07 LAB — CBC
HCT: 32.1 % — ABNORMAL LOW (ref 36.0–46.0)
Hemoglobin: 10.4 g/dL — ABNORMAL LOW (ref 12.0–15.0)
MCH: 28.5 pg (ref 26.0–34.0)
MCHC: 32.4 g/dL (ref 30.0–36.0)
MCV: 87.9 fL (ref 80.0–100.0)
Platelets: 183 10*3/uL (ref 150–400)
RBC: 3.65 MIL/uL — ABNORMAL LOW (ref 3.87–5.11)
RDW: 14 % (ref 11.5–15.5)
WBC: 5.6 10*3/uL (ref 4.0–10.5)
nRBC: 0 % (ref 0.0–0.2)

## 2020-10-07 LAB — BASIC METABOLIC PANEL
Anion gap: 4 — ABNORMAL LOW (ref 5–15)
BUN: 11 mg/dL (ref 6–20)
CO2: 25 mmol/L (ref 22–32)
Calcium: 8.2 mg/dL — ABNORMAL LOW (ref 8.9–10.3)
Chloride: 111 mmol/L (ref 98–111)
Creatinine, Ser: 0.46 mg/dL (ref 0.44–1.00)
GFR, Estimated: >60 mL/min (ref >60)
Glucose, Bld: 97 mg/dL (ref 70–99)
Potassium: 3.6 mmol/L (ref 3.5–5.1)
Sodium: 140 mmol/L (ref 135–145)

## 2020-10-07 MED ORDER — METOPROLOL SUCCINATE ER 25 MG PO TB24: 12.5000 mg | ORAL_TABLET | Freq: Every day | ORAL | 3 refills | Status: AC

## 2020-10-07 NOTE — Progress Notes (Signed)
RN reviewed discharge paperwork with pt via Ipad interpreter Bonita Quin [phone]. All questions addressed. Educated on new script for metoprolol. RN administered metoprolol per MD order at this time and informed pt not to take again until tomorrow. Pt verbalized understanding. IVs and tele monitor will be removed once her ride arrives. Pt is calling her friend to come pick her up.

## 2020-10-07 NOTE — Discharge Summary (Signed)
Physician Discharge Summary  Meghin Thivierge VOJ:[id number] DOB: 1996-01-08 DOA: 10/05/2020  PCP: Pcp, No  Admit date: 10/05/2020 Discharge date: 10/07/2020  Admitted From: Home Disposition:  Home  Recommendations for Outpatient Follow-up:  1. Follow up with Cardiology in 1-2 weeks 2. Please obtain BMP/CBC in one week  Home Health:No Equipment/Devices:None  Discharge Condition:Stable CODE STATUS:Full Diet recommendation: Heart Healthy  Brief/Interim Summary: 25 y.o. female Mandarin speaking patient with no significant past medical history comes into the hospital for palpitations that started the day prior to admission.  She relates this is happened one before it was a short episode where she was short of breath with palpitations that resolved within 10 minutes.  This episode started the day prior to admission and has been is constant she relates palpitation with shortness of breath nothing makes it better or worse she does relate some lightheadedness no loss of consciousness no sweating.  Discharge Diagnoses:  Active Problems:   Elevated troponin   SVT (supraventricular tachycardia) (HCC)  Spontaneous supraventricular tachycardia: Twelve-lead EKG on admission showed SVT she was given adenosine and she was converted she was started on oral metoprolol, cardiology was consulted recommended a 2D echo that showed no abnormalities. CT angio of the chest was negative for PE or cardiopulmonary disease. Cardiology recommended to follow-up with them as an outpatient for an ablation.  Elevated troponin: Slight likely demand ischemia in the setting of a heart rate of 1 70-200.  She remained chest pain-free throughout this episode.  Discharge Instructions  Discharge Instructions    Diet - low sodium heart healthy   Complete by: As directed    Increase activity slowly   Complete by: As directed      Allergies as of 10/07/2020   No Known Allergies     Medication List    TAKE these  medications   metoprolol succinate 25 MG 24 hr tablet Commonly known as: TOPROL-XL Take 0.5 tablets (12.5 mg total) by mouth daily.       No Known Allergies  Consultations:  Cardiology   Procedures/Studies: CT Angio Chest PE W and/or Wo Contrast  Result Date: 10/06/2020 CLINICAL DATA:  Chest pain and shortness of breath starting earlier today. EXAM: CT ANGIOGRAPHY CHEST WITH CONTRAST TECHNIQUE: Multidetector CT imaging of the chest was performed using the standard protocol during bolus administration of intravenous contrast. Multiplanar CT image reconstructions and MIPs were obtained to evaluate the vascular anatomy. CONTRAST:  80mL OMNIPAQUE IOHEXOL 350 MG/ML SOLN COMPARISON:  Chest radiograph 10/06/2020 FINDINGS: Cardiovascular: Good opacification of the central and segmental pulmonary arteries. No focal filling defects. No evidence of significant pulmonary embolus. Normal heart size. No pericardial effusions. Normal caliber thoracic aorta. Mediastinum/Nodes: Esophagus is decompressed. No significant lymphadenopathy in the chest. Increased density in the anterior mediastinum likely representing residual thymic tissue. Lungs/Pleura: Lungs are clear. No pleural effusions. No pneumothorax. Upper Abdomen: No acute abnormalities demonstrated in the visualized upper abdomen. Musculoskeletal: No chest wall abnormality. No acute or significant osseous findings. Review of the MIP images confirms the above findings. IMPRESSION: 1. No evidence of significant pulmonary embolus. 2. No evidence of active pulmonary disease. Electronically Signed   By: Burman Nieves M.D.   On: 10/06/2020 01:01   DG Chest Portable 1 View  Result Date: 10/06/2020 CLINICAL DATA:  Dyspnea, hypotension EXAM: PORTABLE CHEST 1 VIEW COMPARISON:  None. FINDINGS: The heart size and mediastinal contours are within normal limits. Both lungs are clear. The visualized skeletal structures are unremarkable. IMPRESSION: No active disease.  Electronically Signed   By: Helyn NumbersAshesh  Parikh MD   On: 10/06/2020 00:32   ECHOCARDIOGRAM COMPLETE  Result Date: 10/06/2020    ECHOCARDIOGRAM REPORT   [facility] Date of Exam: 10/06/2020 Medical Rec #:  [id number]    Height:       65.0 in Accession #:    [id number]   Weight:       94.0 lb Date of Birth:  22-Aug-1995    BSA:          1.435 m Patient Age:    25 years     BP:           103/63 mmHg Patient Gender: F            HR:           64 bpm. Exam Location:  Inpatient Procedure: 2D Echo, Cardiac Doppler and Color Doppler Indications:    Elevated Troponin  History:        Patient has no prior history of Echocardiogram examinations.  Sonographer:    Eulah PontSarah Pirrotta RDCS Referring Phys: [phone] DAVID GIRGUIS IMPRESSIONS  1. Left ventricular ejection fraction, by estimation, is 60 to 65%. The left ventricle has normal function. The left ventricle has no regional wall motion abnormalities. Left ventricular diastolic parameters were normal.  2. Right ventricular systolic function is normal. The right ventricular size is normal.  3. The mitral valve is normal in structure. Trivial mitral valve regurgitation. No evidence of mitral stenosis.  4. The aortic valve is normal in structure. Aortic valve regurgitation is not visualized. No aortic stenosis is present.  5. The inferior vena cava is dilated in size with <50% respiratory variability, suggesting right atrial pressure of 15 mmHg. FINDINGS  Left Ventricle: Left ventricular ejection fraction, by estimation, is 60 to 65%. The left ventricle has normal function. The left ventricle has no regional wall motion abnormalities. The left ventricular internal cavity size was normal in size. There is  no left ventricular hypertrophy. Left ventricular diastolic parameters were normal. Normal left ventricular filling pressure. Right Ventricle: The right ventricular size is normal. No increase in right ventricular wall thickness. Right ventricular systolic function is  normal. Left Atrium: Left atrial size was normal in size. Right Atrium: Right atrial size was normal in size. Pericardium: There is no evidence of pericardial effusion. Mitral Valve: The mitral valve is normal in structure. Trivial mitral valve regurgitation. No evidence of mitral valve stenosis. Tricuspid Valve: The tricuspid valve is normal in structure. Tricuspid valve regurgitation is not demonstrated. No evidence of tricuspid stenosis. Aortic Valve: The aortic valve is normal in structure. Aortic valve regurgitation is not visualized. No aortic stenosis is present. Pulmonic Valve: The pulmonic valve was normal in structure. Pulmonic valve regurgitation is not visualized. No evidence of pulmonic stenosis. Aorta: The aortic root is normal in size and structure. Venous: The inferior vena cava is dilated in size with less than 50% respiratory variability, suggesting right atrial pressure of 15 mmHg. IAS/Shunts: No atrial level shunt detected by color flow Doppler.  LEFT VENTRICLE PLAX 2D LVIDd:         4.30 cm  Diastology LVIDs:         3.00 cm  LV e' medial:    12.60 cm/s LV PW:         0.80 cm  LV E/e' medial:  8.3 LV IVS:        0.50 cm  LV e' lateral:   14.30  cm/s LVOT diam:     1.80 cm  LV E/e' lateral: 7.3 LV SV:         45 LV SV Index:   32 LVOT Area:     2.54 cm  RIGHT VENTRICLE RV S prime:     12.50 cm/s TAPSE (M-mode): 2.3 cm LEFT ATRIUM             Index       RIGHT ATRIUM          Index LA diam:        2.80 cm 1.95 cm/m  RA Area:     7.07 cm LA Vol (A2C):   21.3 ml 14.84 ml/m RA Volume:   10.40 ml 7.25 ml/m LA Vol (A4C):   24.9 ml 17.35 ml/m LA Biplane Vol: 25.0 ml 17.42 ml/m  AORTIC VALVE LVOT Vmax:   84.20 cm/s LVOT Vmean:  62.000 cm/s LVOT VTI:    0.178 m  AORTA Ao Root diam: 2.80 cm Ao Asc diam:  2.60 cm MITRAL VALVE MV Area (PHT): 3.91 cm     SHUNTS MV Decel Time: 194 msec     Systemic VTI:  0.18 m MV E velocity: 104.00 cm/s  Systemic Diam: 1.80 cm MV A velocity: 45.10 cm/s MV E/A ratio:   2.31 Mihai Croitoru MD Electronically signed by Thurmon Fair MD Signature Date/Time: 10/06/2020/12:36:48 PM    Final     (Echo, Carotid, EGD, Colonoscopy, ERCP)    Subjective: No new complaints.  Discharge Exam: Vitals:   10/07/20 0521 10/07/20 0814  BP: 104/72 103/67  Pulse: (!) 56 65  Resp: 20 18  Temp: 98.2 F (36.8 C) 98.2 F (36.8 C)  SpO2: 97% 99%   Vitals:   10/06/20 2002 10/07/20 0033 10/07/20 0521 10/07/20 0814  BP: 100/63 104/61 104/72 103/67  Pulse: 74 67 (!) 56 65  Resp: Temp: 98.6 F (37 C) 98.7 F (37.1 C) 98.2 F (36.8 C) 98.2 F (36.8 C)  TempSrc: Oral Oral Oral   SpO2: 98% 97% 97% 99%  Weight:      Height:        General: Pt is alert, awake, not in acute distress Cardiovascular: RRR, S1/S2 +, no rubs, no gallops Respiratory: CTA bilaterally, no wheezing, no rhonchi Abdominal: Soft, NT, ND, bowel sounds + Extremities: no edema, no cyanosis    The results of significant diagnostics from this hospitalization (including imaging, microbiology, ancillary and laboratory) are listed below for reference.     Microbiology: Recent Results (from the past 240 hour(s))  Resp Panel by RT-PCR (Flu A&B, Covid) Nasopharyngeal Swab     Status: None   Collection Time: 10/06/20  1:03 AM   Specimen: Nasopharyngeal Swab; Nasopharyngeal(NP) swabs in vial transport medium  Result Value Ref Range Status   SARS Coronavirus 2 by RT PCR NEGATIVE NEGATIVE Final    Comment: (NOTE) SARS-CoV-2 target nucleic acids are NOT DETECTED.  The SARS-CoV-2 RNA is generally detectable in upper respiratory specimens during the acute phase of infection. The lowest concentration of SARS-CoV-2 viral copies this assay can detect is 138 copies/mL. A negative result does not preclude SARS-Cov-2 infection and should not be used as the sole basis for treatment or other patient management decisions. A negative result may occur with  improper specimen collection/handling,  submission of specimen other than nasopharyngeal swab, presence of viral mutation(s) within the areas targeted by this assay, and inadequate number of viral copies(<138 copies/mL). A negative result must  be combined with clinical observations, patient history, and epidemiological information. The expected result is Negative.  Fact Sheet for Patients:  BloggerCourse.com  Fact Sheet for Healthcare Providers:  SeriousBroker.it  This test is no t yet approved or cleared by the [place] FDA and  has been authorized for detection and/or diagnosis of SARS-CoV-2 by FDA under an Emergency Use Authorization (EUA). This EUA will remain  in effect (meaning this test can be used) for the duration of the COVID-19 declaration under Section 564(b)(1) of the Act, 21 U.S.C.section 360bbb-3(b)(1), unless the authorization is terminated  or revoked sooner.       Influenza A by PCR NEGATIVE NEGATIVE Final   Influenza B by PCR NEGATIVE NEGATIVE Final    Comment: (NOTE) The Xpert Xpress SARS-CoV-2/FLU/RSV plus assay is intended as an aid in the diagnosis of influenza from Nasopharyngeal swab specimens and should not be used as a sole basis for treatment. Nasal washings and aspirates are unacceptable for Xpert Xpress SARS-CoV-2/FLU/RSV testing.  Fact Sheet for Patients: BloggerCourse.com  Fact Sheet for Healthcare Providers: SeriousBroker.it  This test is not yet approved or cleared by the [place] FDA and has been authorized for detection and/or diagnosis of SARS-CoV-2 by FDA under an Emergency Use Authorization (EUA). This EUA will remain in effect (meaning this test can be used) for the duration of the COVID-19 declaration under Section 564(b)(1) of the Act, 21 U.S.C. section 360bbb-3(b)(1), unless the authorization is terminated or revoked.  Performed at [facility], [address], [place], [place] [address]      Labs: BNP (last 3 results) Recent Labs    10/06/20 0505  BNP 286.6*   Basic Metabolic Panel: Recent Labs  Lab 10/06/20 0011 10/06/20 0016 10/06/20 0730 10/07/20 0440  NA 139 139 138 140  K 4.7 5.5* 3.5 3.6  CL 106 106 112* 111  CO2 21*  --  21* 25  GLUCOSE 149* 149* 103* 97  BUN 16 21* 12 11  CREATININE 0.81 0.60 0.46 0.46  CALCIUM 9.4  --  7.2* 8.2*   Liver Function Tests: Recent Labs  Lab 10/06/20 0011  AST 64*  ALT 39  ALKPHOS 52  BILITOT 1.1  PROT 7.4  ALBUMIN 4.7   No results for input(s): LIPASE, AMYLASE in the last 168 hours. No results for input(s): AMMONIA in the last 168 hours. CBC: Recent Labs  Lab 10/06/20 0011 10/06/20 0016 10/07/20 0609  WBC 13.3*  --  5.6  NEUTROABS 11.9*  --   --   HGB 13.8 13.6 10.4*  HCT 42.5 40.0 32.1*  MCV 87.4  --  87.9  PLT 254  --  183   Cardiac Enzymes: No results for input(s): CKTOTAL, CKMB, CKMBINDEX, TROPONINI in the last 168 hours. BNP: Invalid input(s): POCBNP CBG: No results for input(s): GLUCAP in the last 168 hours. D-Dimer No results for input(s): DDIMER in the last 72 hours. Hgb A1c Recent Labs    10/05/20 2022  HGBA1C 5.4   Lipid Profile Recent Labs    10/06/20 0235  CHOL 130  HDL 57  LDLCALC 67  TRIG 28  CHOLHDL 2.3   Thyroid function studies Recent Labs    10/06/20 0000  TSH 0.622   Anemia work up No results for input(s): VITAMINB12, FOLATE, FERRITIN, TIBC, IRON, RETICCTPCT in the last 72 hours. Urinalysis No results found for: COLORURINE, APPEARANCEUR, LABSPEC, PHURINE, GLUCOSEU, HGBUR, BILIRUBINUR, KETONESUR, PROTEINUR, UROBILINOGEN, NITRITE, LEUKOCYTESUR Sepsis Labs Invalid input(s): PROCALCITONIN,  WBC,  LACTICIDVEN Microbiology Recent Results (from the past 240 hour(s))  Resp Panel by RT-PCR (Flu A&B, Covid) Nasopharyngeal Swab     Status: None   Collection Time: 10/06/20  1:03 AM   Specimen: Nasopharyngeal  Swab; Nasopharyngeal(NP) swabs in vial transport medium  Result Value Ref Range Status   SARS Coronavirus 2 by RT PCR NEGATIVE NEGATIVE Final    Comment: (NOTE) SARS-CoV-2 target nucleic acids are NOT DETECTED.  The SARS-CoV-2 RNA is generally detectable in upper respiratory specimens during the acute phase of infection. The lowest concentration of SARS-CoV-2 viral copies this assay can detect is 138 copies/mL. A negative result does not preclude SARS-Cov-2 infection and should not be used as the sole basis for treatment or other patient management decisions. A negative result may occur with  improper specimen collection/handling, submission of specimen other than nasopharyngeal swab, presence of viral mutation(s) within the areas targeted by this assay, and inadequate number of viral copies(<138 copies/mL). A negative result must be combined with clinical observations, patient history, and epidemiological information. The expected result is Negative.  Fact Sheet for Patients:  BloggerCourse.com  Fact Sheet for Healthcare Providers:  SeriousBroker.it  This test is no t yet approved or cleared by the [place] FDA and  has been authorized for detection and/or diagnosis of SARS-CoV-2 by FDA under an Emergency Use Authorization (EUA). This EUA will remain  in effect (meaning this test can be used) for the duration of the COVID-19 declaration under Section 564(b)(1) of the Act, 21 U.S.C.section 360bbb-3(b)(1), unless the authorization is terminated  or revoked sooner.       Influenza A by PCR NEGATIVE NEGATIVE Final   Influenza B by PCR NEGATIVE NEGATIVE Final    Comment: (NOTE) The Xpert Xpress SARS-CoV-2/FLU/RSV plus assay is intended as an aid in the diagnosis of influenza from Nasopharyngeal swab specimens and should not be used as a sole basis for treatment. Nasal washings and aspirates are unacceptable for Xpert Xpress  SARS-CoV-2/FLU/RSV testing.  Fact Sheet for Patients: BloggerCourse.com  Fact Sheet for Healthcare Providers: SeriousBroker.it  This test is not yet approved or cleared by the [place] FDA and has been authorized for detection and/or diagnosis of SARS-CoV-2 by FDA under an Emergency Use Authorization (EUA). This EUA will remain in effect (meaning this test can be used) for the duration of the COVID-19 declaration under Section 564(b)(1) of the Act, 21 U.S.C. section 360bbb-3(b)(1), unless the authorization is terminated or revoked.  Performed at [facility], [address] [place], [place] [address]      Time coordinating discharge: Over 30 minutes  SIGNED:   Marinda Elk, MD  [facility] 10/07/2020, 8:42 AM Pager   If 7PM-7AM, please contact night-coverage [email or web address] Password [email or web address]

## 2020-10-07 NOTE — Progress Notes (Signed)
IV x 2 removed and tele removed. No further needs. Pt being discharged with her friend.

## 2020-11-02 NOTE — Progress Notes (Deleted)
Cardiology Office Note:    Date:  11/02/2020   ID:  Casey Romero, DOB [birth year], MRN [id number]  PCP:  Pcp, No Referring MD: No ref. provider found    [facility]  Cardiologist:  Chrystie Nose, MD ***  Reason for visit: ***  History of Present Illness:    Casey Romero is a 25 y.o. female, Mandarin speaking patient, with no significant past medical history was admitted 10/05/20 for palpitations that started the day prior to admission. She had associated shortness of breath and lightheadedness.  EKG on admission showed SVT  After 6 mg of adenosine, she promptly converted to a sinus bradycardia in the 50s.  She was started on Toprol XL 12.5mg .  2D echo showed no abnormalities.  CT angio of the chest was negative for PE or cardiopulmonary disease.  Elevated troponin likely demand ischemia in the setting of a heart rate of 170-200.  To note, patient lives in [place].   SVT - converted with adenosine 6mg  on 10/05/20 - cont with Toprol XL    Past Medical History:  Diagnosis Date  . PSVT (paroxysmal supraventricular tachycardia) (HCC)     No past surgical history on file.  Current Medications: No outpatient medications have been marked as taking for the 11/03/20 encounter (Appointment) with 11/05/20, PA.     Allergies:   Patient has no known allergies.   Social History   Socioeconomic History  . Marital status: Single    Spouse name: Not on file  . Number of children: Not on file  . Years of education: Not on file  . Highest education level: Not on file  Occupational History  . Not on file  Tobacco Use  . Smoking status: Never Smoker  . Smokeless tobacco: Never Used  Substance and Sexual Activity  . Alcohol use: Not Currently  . Drug use: Not on file  . Sexual activity: Not on file  Other Topics Concern  . Not on file  Social History Narrative  . Not on file   Social Determinants of Health   Financial Resource Strain: Not on file   Food Insecurity: Not on file  Transportation Needs: Not on file  Physical Activity: Not on file  Stress: Not on file  Social Connections: Not on file     Family History: The patient's family history includes Hypertension in her father; Supraventricular tachycardia in her mother.  ROS:   Please see the history of present illness.     EKGs/Labs/Other Studies Reviewed:    EKG:  The ekg ordered today demonstrates ***  Recent Labs: 10/06/2020: ALT 39; B Natriuretic Peptide 286.6; TSH 0.622 10/07/2020: BUN 11; Creatinine, Ser 0.46; Hemoglobin 10.4; Platelets 183; Potassium 3.6; Sodium 140  Recent Lipid Panel    Component Value Date/Time   CHOL 130 10/06/2020 0235   TRIG 28 10/06/2020 0235   HDL 57 10/06/2020 0235   CHOLHDL 2.3 10/06/2020 0235   VLDL 6 10/06/2020 0235   LDLCALC 67 10/06/2020 0235    Physical Exam:    VS:  There were no vitals taken for this visit.    Wt Readings from Last 3 Encounters:  10/05/20 94 lb (42.6 kg)     GEN: *** Well nourished, well developed in no acute distress HEENT: Normal NECK: No JVD; No carotid bruits CARDIAC: ***RRR, no murmurs, rubs, gallops RESPIRATORY:  Clear to auscultation without rales, wheezing or rhonchi  ABDOMEN: Soft, non-tender, non-distended MUSCULOSKELETAL: No edema; No deformity  SKIN: Warm and dry NEUROLOGIC:  Alert and oriented PSYCHIATRIC:  Normal affect   ASSESSMENT AND PLAN   ***  Disposition: ***  {Are you ordering a CV Procedure (e.g. stress test, cath, DCCV, TEE, etc)?   Press F2        :[id number]}    Medication Adjustments/Labs and Tests Ordered: Current medicines are reviewed at length with the patient today.  Concerns regarding medicines are outlined above.  No orders of the defined types were placed in this encounter.  No orders of the defined types were placed in this encounter.   There are no Patient Instructions on file for this visit.   Signed, Cannon Kettle, PA-C  11/02/2020 9:44 PM     [facility]

## 2020-11-03 ENCOUNTER — Ambulatory Visit: Payer: Self-pay | Admitting: Physician Assistant

## 2020-11-03 DIAGNOSIS — I471 Supraventricular tachycardia: Secondary | ICD-10-CM

## 2022-04-19 IMAGING — CT CT ANGIO CHEST
3 of 7 series · 17 of 36 positions shown · IV contrast (omnipaque)
Comparison: Chest radiograph 10/06/2020

CLINICAL DATA: Chest pain and shortness of breath starting earlier
today.

EXAM:
CT ANGIOGRAPHY CHEST WITH CONTRAST
TECHNIQUE: Multidetector CT imaging of the chest was performed using the
standard protocol during bolus administration of intravenous
contrast. Multiplanar CT image reconstructions and MIPs were
obtained to evaluate the vascular anatomy.
CONTRAST:  85mL OMNIPAQUE IOHEXOL 350 MG/ML SOLN

[Series 5: thins · axial · 0.66mm/px · z∈[+1280,+1547]mm · 12 of 317 slices shown]
[im 25/317  lung]
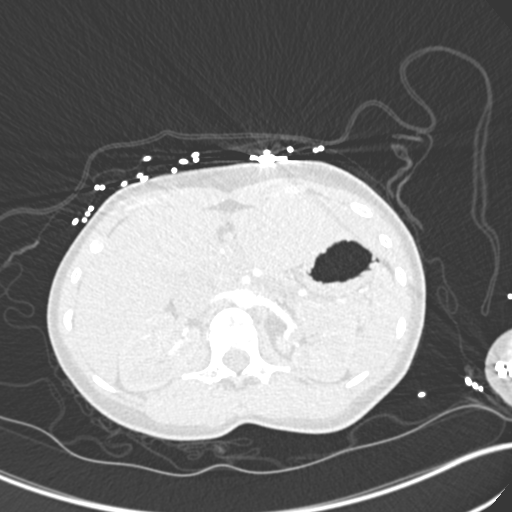
[im 49/317  mediastinal]
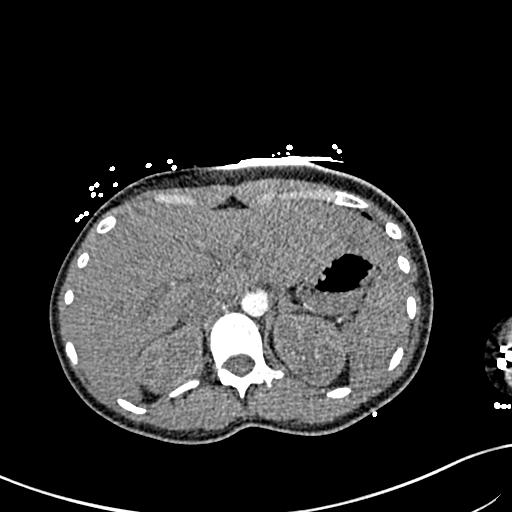
[im 73/317  lung]
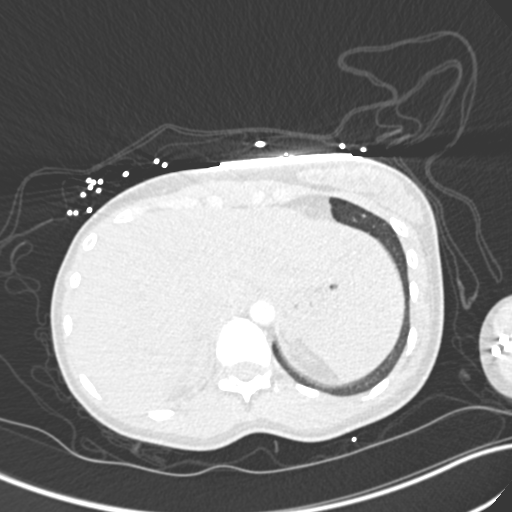
[im 98/317  mediastinal]
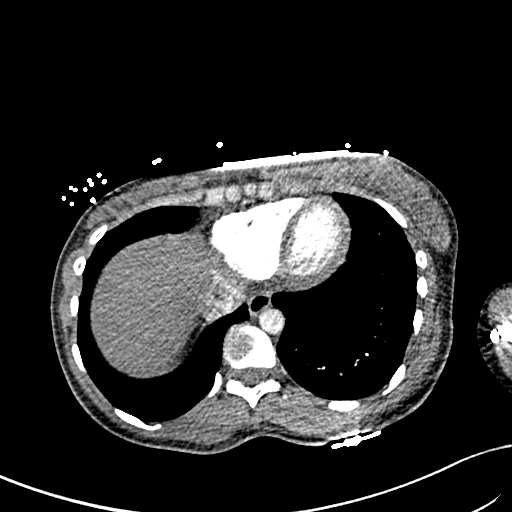
[im 122/317  lung]
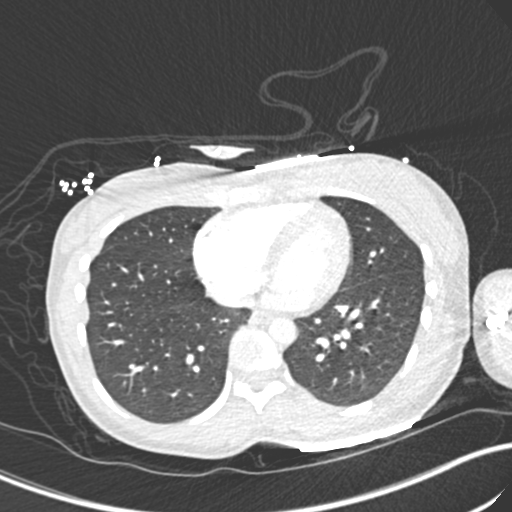
[im 146/317  mediastinal]
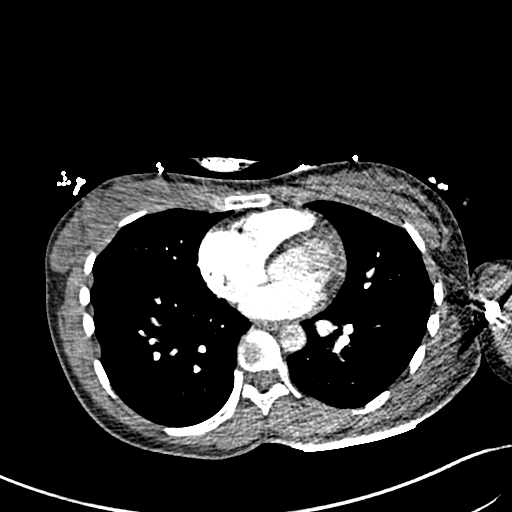
[im 171/317  lung]
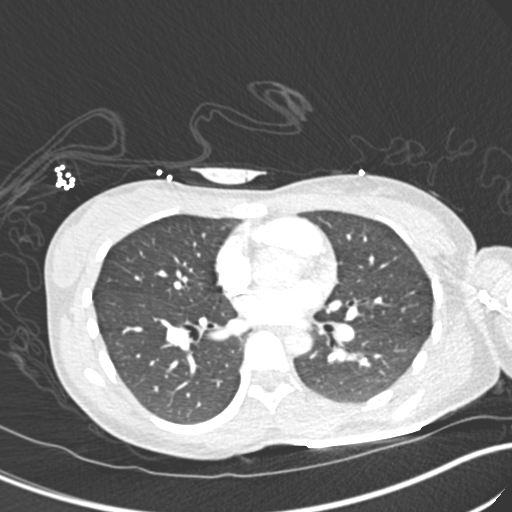
[im 195/317  mediastinal]
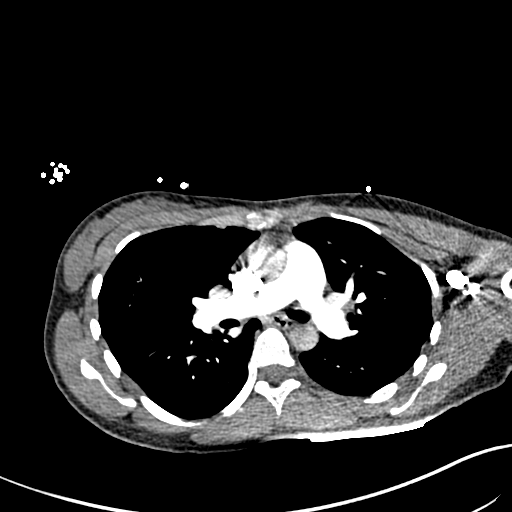
[im 219/317  lung]
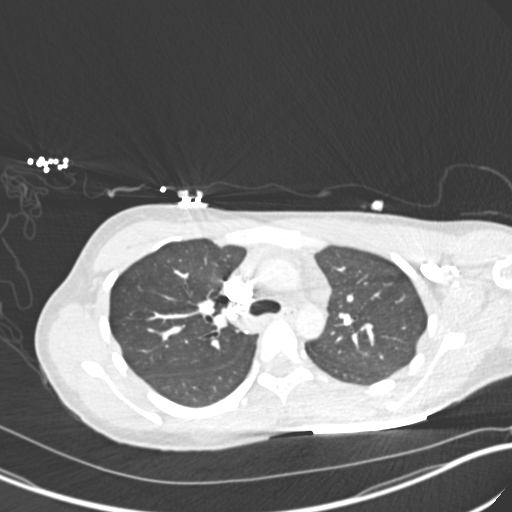
[im 244/317  mediastinal]
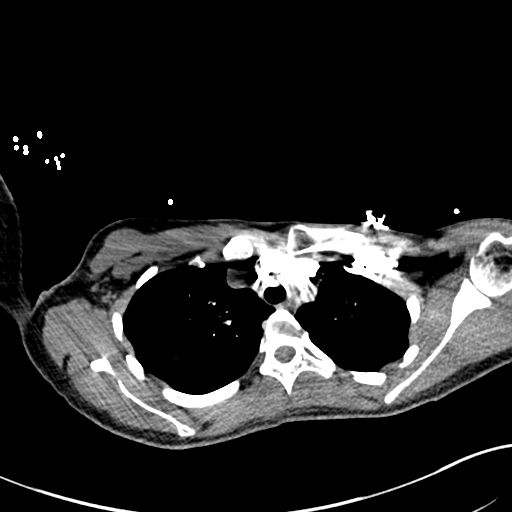
[im 268/317  lung]
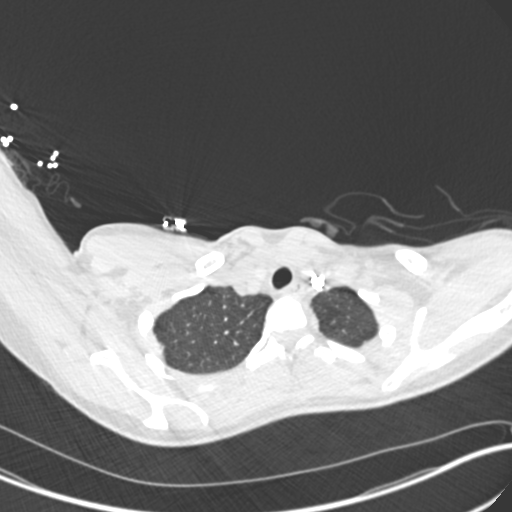
[im 292/317  mediastinal]
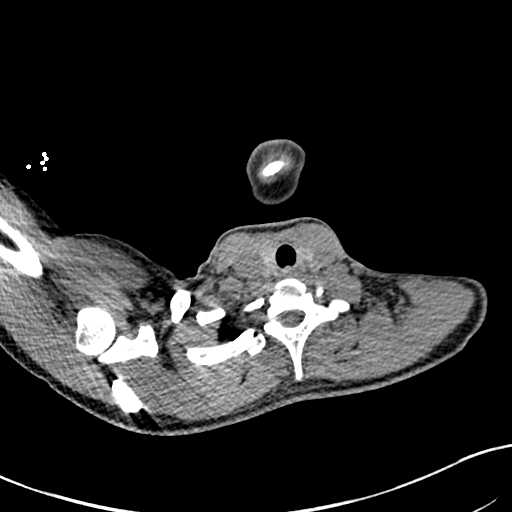

[Series 6: coronal mpr · coronal · 0.65mm/px · 1 of 115 slices shown]
[im 58/115  mediastinal]
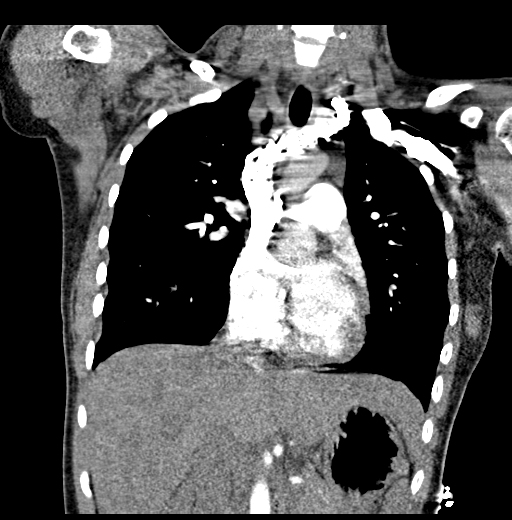

[Series 10: lung · axial · 0.66mm/px · z∈[+1348,+1516]mm · 4 of 140 slices shown]
[im 28/140  mediastinal]
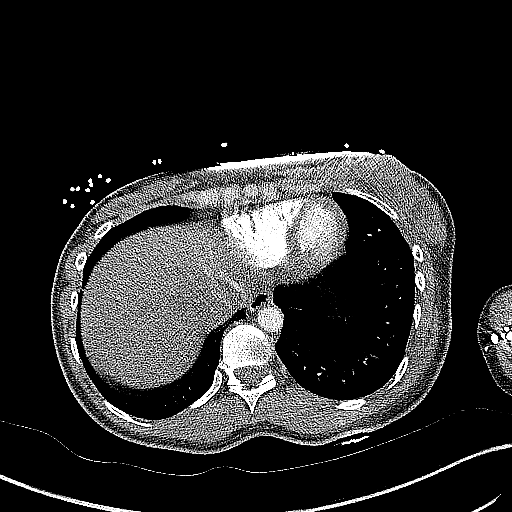
[im 56/140  mediastinal]
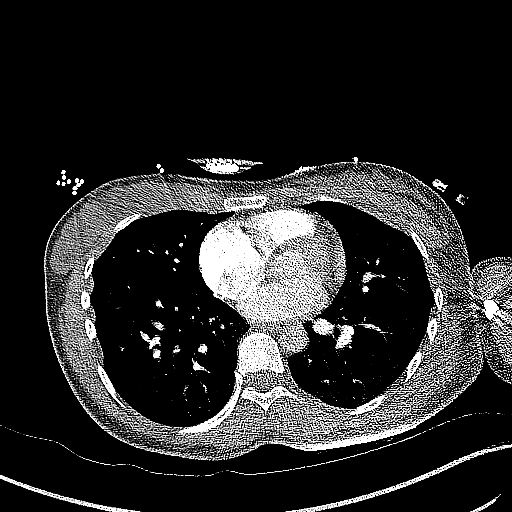
[im 84/140  mediastinal]
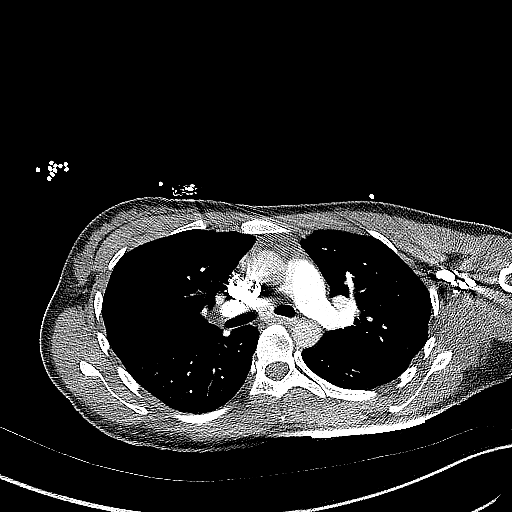
[im 112/140  mediastinal]
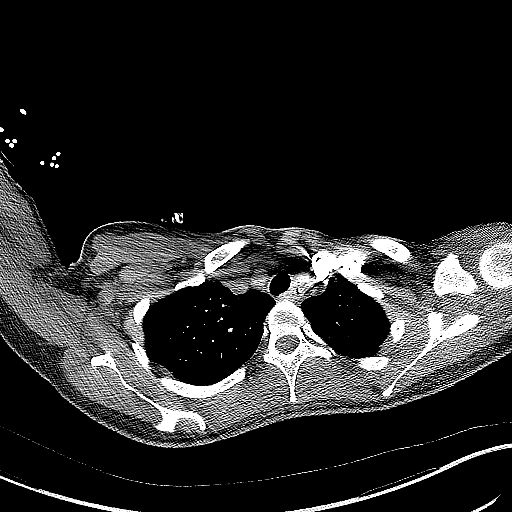

[17 of 36 positions shown; findings below may reference images not displayed]

FINDINGS: Cardiovascular: Good opacification of the central and segmental
pulmonary arteries. No focal filling defects. No evidence of
significant pulmonary embolus. Normal heart size. No pericardial
effusions. Normal caliber thoracic aorta.

Mediastinum/Nodes: Esophagus is decompressed. No significant
lymphadenopathy in the chest. Increased density in the anterior
mediastinum likely representing residual thymic tissue.

Lungs/Pleura: Lungs are clear. No pleural effusions. No
pneumothorax.

Upper Abdomen: No acute abnormalities demonstrated in the visualized
upper abdomen.

Musculoskeletal: No chest wall abnormality. No acute or significant
osseous findings.

Review of the MIP images confirms the above findings.
IMPRESSION: 1. No evidence of significant pulmonary embolus.
2. No evidence of active pulmonary disease.
